# Patient Record
Sex: Female | Born: 1955
Health system: Southern US, Community
[De-identification: ages and names within clinical notes are randomized; demographics above are authoritative.]

## PROBLEM LIST (undated history)

## (undated) DIAGNOSIS — C801 Malignant (primary) neoplasm, unspecified: Secondary | ICD-10-CM

## (undated) DIAGNOSIS — E669 Obesity, unspecified: Secondary | ICD-10-CM

## (undated) DIAGNOSIS — I10 Essential (primary) hypertension: Secondary | ICD-10-CM

## (undated) DIAGNOSIS — Z8719 Personal history of other diseases of the digestive system: Secondary | ICD-10-CM

## (undated) DIAGNOSIS — E049 Nontoxic goiter, unspecified: Secondary | ICD-10-CM

## (undated) DIAGNOSIS — E7800 Pure hypercholesterolemia, unspecified: Secondary | ICD-10-CM

## (undated) DIAGNOSIS — J45909 Unspecified asthma, uncomplicated: Secondary | ICD-10-CM

---

## 2006-02-21 ENCOUNTER — Ambulatory Visit: Payer: Self-pay | Admitting: Internal Medicine

## 2006-03-05 ENCOUNTER — Other Ambulatory Visit: Payer: Self-pay

## 2006-03-05 ENCOUNTER — Inpatient Hospital Stay: Payer: Self-pay | Admitting: Internal Medicine

## 2006-04-28 ENCOUNTER — Ambulatory Visit: Payer: Self-pay | Admitting: Unknown Physician Specialty

## 2006-05-29 ENCOUNTER — Ambulatory Visit: Payer: Self-pay | Admitting: Internal Medicine

## 2009-08-17 ENCOUNTER — Ambulatory Visit: Payer: Self-pay

## 2013-04-22 ENCOUNTER — Ambulatory Visit: Payer: Self-pay | Admitting: Internal Medicine

## 2015-12-05 ENCOUNTER — Other Ambulatory Visit: Payer: Self-pay | Admitting: Internal Medicine

## 2015-12-05 DIAGNOSIS — Z1231 Encounter for screening mammogram for malignant neoplasm of breast: Secondary | ICD-10-CM

## 2015-12-07 ENCOUNTER — Ambulatory Visit
Admission: RE | Admit: 2015-12-07 | Discharge: 2015-12-07 | Disposition: A | Discharge status: Home / Self Care | Payer: 59 | Source: Ambulatory Visit | Attending: Internal Medicine | Admitting: Internal Medicine

## 2015-12-07 ENCOUNTER — Other Ambulatory Visit: Payer: Self-pay | Admitting: Internal Medicine

## 2015-12-07 DIAGNOSIS — Z1231 Encounter for screening mammogram for malignant neoplasm of breast: Secondary | ICD-10-CM

## 2016-02-20 DIAGNOSIS — H524 Presbyopia: Secondary | ICD-10-CM | POA: Diagnosis not present

## 2016-02-23 DIAGNOSIS — I10 Essential (primary) hypertension: Secondary | ICD-10-CM | POA: Diagnosis not present

## 2016-02-23 DIAGNOSIS — E7800 Pure hypercholesterolemia, unspecified: Secondary | ICD-10-CM | POA: Diagnosis not present

## 2016-03-01 DIAGNOSIS — I10 Essential (primary) hypertension: Secondary | ICD-10-CM | POA: Diagnosis not present

## 2016-03-01 DIAGNOSIS — R7301 Impaired fasting glucose: Secondary | ICD-10-CM | POA: Diagnosis not present

## 2016-03-01 DIAGNOSIS — R739 Hyperglycemia, unspecified: Secondary | ICD-10-CM | POA: Diagnosis not present

## 2016-03-01 DIAGNOSIS — E6609 Other obesity due to excess calories: Secondary | ICD-10-CM | POA: Diagnosis not present

## 2016-03-01 DIAGNOSIS — E7800 Pure hypercholesterolemia, unspecified: Secondary | ICD-10-CM | POA: Diagnosis not present

## 2016-05-03 DIAGNOSIS — K219 Gastro-esophageal reflux disease without esophagitis: Secondary | ICD-10-CM | POA: Diagnosis not present

## 2016-05-03 DIAGNOSIS — Z1211 Encounter for screening for malignant neoplasm of colon: Secondary | ICD-10-CM | POA: Diagnosis not present

## 2016-05-28 DIAGNOSIS — R7301 Impaired fasting glucose: Secondary | ICD-10-CM | POA: Diagnosis not present

## 2016-06-04 DIAGNOSIS — E7800 Pure hypercholesterolemia, unspecified: Secondary | ICD-10-CM | POA: Diagnosis not present

## 2016-06-04 DIAGNOSIS — R739 Hyperglycemia, unspecified: Secondary | ICD-10-CM | POA: Diagnosis not present

## 2016-06-04 DIAGNOSIS — I10 Essential (primary) hypertension: Secondary | ICD-10-CM | POA: Diagnosis not present

## 2016-06-04 DIAGNOSIS — E6609 Other obesity due to excess calories: Secondary | ICD-10-CM | POA: Diagnosis not present

## 2016-07-12 ENCOUNTER — Encounter: Payer: Self-pay | Admitting: *Deleted

## 2016-07-15 ENCOUNTER — Ambulatory Visit: Payer: 59 | Admitting: Anesthesiology

## 2016-07-15 ENCOUNTER — Encounter: Payer: Self-pay | Admitting: *Deleted

## 2016-07-15 ENCOUNTER — Encounter
Admission: RE | Disposition: A | Discharge status: Home Health | Payer: Self-pay | Source: Ambulatory Visit | Attending: Unknown Physician Specialty

## 2016-07-15 ENCOUNTER — Ambulatory Visit
Admission: RE | Admit: 2016-07-15 | Discharge: 2016-07-15 | Disposition: A | Discharge status: Home Health | Payer: 59 | Source: Ambulatory Visit | Attending: Unknown Physician Specialty | Admitting: Unknown Physician Specialty

## 2016-07-15 DIAGNOSIS — K210 Gastro-esophageal reflux disease with esophagitis: Secondary | ICD-10-CM | POA: Insufficient documentation

## 2016-07-15 DIAGNOSIS — J45909 Unspecified asthma, uncomplicated: Secondary | ICD-10-CM | POA: Diagnosis not present

## 2016-07-15 DIAGNOSIS — Z6841 Body Mass Index (BMI) 40.0 and over, adult: Secondary | ICD-10-CM | POA: Insufficient documentation

## 2016-07-15 DIAGNOSIS — E7800 Pure hypercholesterolemia, unspecified: Secondary | ICD-10-CM | POA: Insufficient documentation

## 2016-07-15 DIAGNOSIS — K5730 Diverticulosis of large intestine without perforation or abscess without bleeding: Secondary | ICD-10-CM | POA: Insufficient documentation

## 2016-07-15 DIAGNOSIS — K648 Other hemorrhoids: Secondary | ICD-10-CM | POA: Insufficient documentation

## 2016-07-15 DIAGNOSIS — K5790 Diverticulosis of intestine, part unspecified, without perforation or abscess without bleeding: Secondary | ICD-10-CM | POA: Diagnosis not present

## 2016-07-15 DIAGNOSIS — Z1211 Encounter for screening for malignant neoplasm of colon: Secondary | ICD-10-CM | POA: Diagnosis not present

## 2016-07-15 DIAGNOSIS — K2950 Unspecified chronic gastritis without bleeding: Secondary | ICD-10-CM | POA: Diagnosis not present

## 2016-07-15 DIAGNOSIS — E669 Obesity, unspecified: Secondary | ICD-10-CM | POA: Insufficient documentation

## 2016-07-15 DIAGNOSIS — I10 Essential (primary) hypertension: Secondary | ICD-10-CM | POA: Diagnosis not present

## 2016-07-15 DIAGNOSIS — K649 Unspecified hemorrhoids: Secondary | ICD-10-CM | POA: Diagnosis not present

## 2016-07-15 DIAGNOSIS — K2970 Gastritis, unspecified, without bleeding: Secondary | ICD-10-CM | POA: Diagnosis not present

## 2016-07-15 DIAGNOSIS — K3189 Other diseases of stomach and duodenum: Secondary | ICD-10-CM | POA: Diagnosis not present

## 2016-07-15 HISTORY — DX: Nontoxic goiter, unspecified: E04.9

## 2016-07-15 HISTORY — DX: Obesity, unspecified: E66.9

## 2016-07-15 HISTORY — DX: Essential (primary) hypertension: I10

## 2016-07-15 HISTORY — DX: Pure hypercholesterolemia, unspecified: E78.00

## 2016-07-15 HISTORY — PX: ESOPHAGOGASTRODUODENOSCOPY (EGD) WITH PROPOFOL: SHX5813

## 2016-07-15 HISTORY — DX: Unspecified asthma, uncomplicated: J45.909

## 2016-07-15 HISTORY — DX: Personal history of other diseases of the digestive system: Z87.19

## 2016-07-15 HISTORY — PX: COLONOSCOPY WITH PROPOFOL: SHX5780

## 2016-07-15 SURGERY — COLONOSCOPY WITH PROPOFOL
Anesthesia: General

## 2016-07-15 MED ORDER — SODIUM CHLORIDE 0.9 % IV SOLN
INTRAVENOUS | Status: DC
  Administered 2016-07-15: 15:00:00 via INTRAVENOUS
  Administered 2016-07-15: 1000 mL via INTRAVENOUS

## 2016-07-15 MED ORDER — PROPOFOL 500 MG/50ML IV EMUL
INTRAVENOUS | Status: DC | PRN
  Administered 2016-07-15: 150 ug/kg/min via INTRAVENOUS

## 2016-07-15 MED ORDER — PROPOFOL 10 MG/ML IV BOLUS
INTRAVENOUS | Status: DC | PRN
  Administered 2016-07-15: 50 mg via INTRAVENOUS

## 2016-07-15 MED ORDER — MIDAZOLAM HCL 2 MG/2ML IJ SOLN
INTRAMUSCULAR | Status: DC | PRN
  Administered 2016-07-15: 2 mg via INTRAVENOUS

## 2016-07-15 MED ORDER — SODIUM CHLORIDE 0.9 % IV SOLN: INTRAVENOUS | Status: DC

## 2016-07-15 MED ORDER — LIDOCAINE HCL (CARDIAC) 20 MG/ML IV SOLN
INTRAVENOUS | Status: DC | PRN
  Administered 2016-07-15: 100 mg via INTRAVENOUS

## 2016-07-15 NOTE — Anesthesia Preprocedure Evaluation (Signed)
Anesthesia Evaluation  Patient identified by MRN, date of birth, ID band Patient awake    Reviewed: Allergy & Precautions, H&P , NPO status , Patient's Chart, lab work & pertinent test results, reviewed documented beta blocker date and time   Airway Mallampati: II   Neck ROM: full    Dental  (+) Teeth Intact   Pulmonary neg pulmonary ROS, neg shortness of breath, asthma ,    Pulmonary exam normal        Cardiovascular hypertension, negative cardio ROS Normal cardiovascular exam Rhythm:regular Rate:Normal     Neuro/Psych negative neurological ROS  negative psych ROS   GI/Hepatic negative GI ROS, Neg liver ROS,   Endo/Other  negative endocrine ROS  Renal/GU negative Renal ROS  negative genitourinary   Musculoskeletal   Abdominal   Peds  Hematology negative hematology ROS (+)   Anesthesia Other Findings Past Medical History: No date: Goiter No date: Hypercholesterolemia No date: Hypertension No date: Obesity No past surgical history on file.   Reproductive/Obstetrics                             Anesthesia Physical Anesthesia Plan  ASA: III  Anesthesia Plan: General   Post-op Pain Management:    Induction:   Airway Management Planned:   Additional Equipment:   Intra-op Plan:   Post-operative Plan:   Informed Consent: I have reviewed the patients History and Physical, chart, labs and discussed the procedure including the risks, benefits and alternatives for the proposed anesthesia with the patient or authorized representative who has indicated his/her understanding and acceptance.   Dental Advisory Given  Plan Discussed with: CRNA  Anesthesia Plan Comments:         Anesthesia Quick Evaluation

## 2016-07-15 NOTE — H&P (Signed)
   Primary Care Physician:  Madelyn Brunner, MD Primary Gastroenterologist:  Dr. Vira Agar  Pre-Procedure History & Physical: HPI:  Bianca Cooke is a 60 y.o. female is here for an endoscopy and colonoscopy.   Past Medical History:  Diagnosis Date  . Asthma   . Goiter   . History of hiatal hernia   . Hypercholesterolemia   . Hypertension   . Obesity     Past Surgical History:  Procedure Laterality Date  . CESAREAN SECTION      Prior to Admission medications   Medication Sig Start Date End Date Taking? Authorizing Provider  albuterol (ACCUNEB) 0.63 MG/3ML nebulizer solution Take 1 ampule by nebulization every 6 (six) hours as needed for wheezing.   Yes Historical Provider, MD  famotidine (PEPCID) 20 MG tablet Take 20 mg by mouth 2 (two) times daily.   Yes Historical Provider, MD  fexofenadine (ALLEGRA) 180 MG tablet Take 180 mg by mouth daily.   Yes Historical Provider, MD  fluticasone (FLONASE) 50 MCG/ACT nasal spray Place into both nostrils daily.   Yes Historical Provider, MD  hydrochlorothiazide (HYDRODIURIL) 25 MG tablet Take 25 mg by mouth daily.   Yes Historical Provider, MD  LORazepam (ATIVAN) 0.5 MG tablet Take 0.5 mg by mouth every 8 (eight) hours.   Yes Historical Provider, MD  losartan (COZAAR) 50 MG tablet Take 50 mg by mouth daily.   Yes Historical Provider, MD    Allergies as of 06/26/2016  . (Not on File)    Family History  Problem Relation Age of Onset  . Breast cancer Maternal Aunt   . Breast cancer Maternal Grandmother   . Breast cancer Maternal Aunt     Social History   Social History  . Marital status: Married    Spouse name: N/A  . Number of children: N/A  . Years of education: N/A   Occupational History  . Not on file.   Social History Main Topics  . Smoking status: Never Smoker  . Smokeless tobacco: Never Used  . Alcohol use Yes     Comment: occasional wine  . Drug use: No  . Sexual activity: Not on file   Other Topics Concern   . Not on file   Social History Narrative  . No narrative on file    Review of Systems: See HPI, otherwise negative ROS  Physical Exam: BP 124/81   Pulse 91   Temp (!) 96.7 F (35.9 C) (Tympanic)   Resp 13   Ht 5\' 3"  (1.6 m)   Wt 113.4 kg (250 lb)   SpO2 99%   BMI 44.29 kg/m  General:   Alert,  pleasant and cooperative in NAD Head:  Normocephalic and atraumatic. Neck:  Supple; no masses or thyromegaly. Lungs:  Clear throughout to auscultation.    Heart:  Regular rate and rhythm. Abdomen:  Soft, nontender and nondistended. Normal bowel sounds, without guarding, and without rebound.   Neurologic:  Alert and  oriented x4;  grossly normal neurologically.  Impression/Plan: Bianca Cooke is here for an endoscopy and colonoscopy to be performed for screening and GERD  Risks, benefits, limitations, and alternatives regarding  endoscopy and colonoscopy have been reviewed with the patient.  Questions have been answered.  All parties agreeable.   Gaylyn Cheers, MD  07/15/2016, 4:04 PM

## 2016-07-15 NOTE — Op Note (Signed)
[facility] Patient Name: Bianca Cooke Procedure Date: 07/15/2016 3:23 PM MRN: [id number] Account #: [id number] Date of Birth: 06-08-1956 Admit Type: Outpatient Age: 60 Room: [facility] ENDO ROOM 4 Gender: Female Note Status: Finalized Procedure:            Upper GI endoscopy Indications:          Heartburn Providers:            Manya Silvas, MD Referring MD:         Hewitt Blade. Sarina Ser, MD (Referring MD) Medicines:            Propofol per Anesthesia Complications:        No immediate complications. Procedure:            Pre-Anesthesia Assessment:                       - After reviewing the risks and benefits, the patient                        was deemed in satisfactory condition to undergo the                        procedure.                       After obtaining informed consent, the endoscope was                        passed under direct vision. Throughout the procedure,                        the patient's blood pressure, pulse, and oxygen                        saturations were monitored continuously. The Endoscope                        was introduced through the mouth, and advanced to the                        second part of duodenum. The upper GI endoscopy was                        accomplished without difficulty. The patient tolerated                        the procedure well. Findings:      LA Grade A (one or more mucosal breaks less than 5 mm, not extending       between tops of 2 mucosal folds) esophagitis with no bleeding was found       40 cm from the incisors. Biopsies were taken with a cold forceps for       histology. GEJ 41cm.      Diffuse mildly erythematous mucosa without bleeding was found in the       gastric antrum. Biopsies were taken with a cold forceps for histology.       Biopsies were taken with a cold forceps for Helicobacter pylori testing.      The examined duodenum was normal. Impression:           - LA Grade  A reflux esophagitis. Rule out Barrett's                        esophagus. Biopsied.                       - Erythematous mucosa in the antrum. Biopsied.                       - Normal examined duodenum. Recommendation:       - Await pathology results.                       - Perform a colonoscopy as previously scheduled. Manya Silvas, MD 07/15/2016 3:39:18 PM This report has been signed electronically. Number of Addenda: 0 Note Initiated On: 07/15/2016 3:23 PM      [facility]

## 2016-07-15 NOTE — Transfer of Care (Signed)
Immediate Anesthesia Transfer of Care Note  Patient: JANEICE WELTON  Procedure(s) Performed: Procedure(s): COLONOSCOPY WITH PROPOFOL (N/A) ESOPHAGOGASTRODUODENOSCOPY (EGD) WITH PROPOFOL (N/A)  Patient Location: Endoscopy Unit  Anesthesia Type:General  Level of Consciousness: awake, alert , oriented and patient cooperative  Airway & Oxygen Therapy: Patient Spontanous Breathing and Patient connected to nasal cannula oxygen  Post-op Assessment: Report given to RN, Post -op Vital signs reviewed and stable and Patient moving all extremities X 4  Post vital signs: Reviewed and stable  Last Vitals:  Vitals:   07/15/16 1436 07/15/16 1556  BP: (!) 151/97   Pulse: 93   Resp: 18   Temp: 37.4 C (!) (P) 35.9 C    Last Pain:  Vitals:   07/15/16 1556  TempSrc: (P) Tympanic         Complications: No apparent anesthesia complications

## 2016-07-15 NOTE — Anesthesia Postprocedure Evaluation (Signed)
Anesthesia Post Note  Patient: SHAWNDREA IRIGOYEN  Procedure(s) Performed: Procedure(s) (LRB): COLONOSCOPY WITH PROPOFOL (N/A) ESOPHAGOGASTRODUODENOSCOPY (EGD) WITH PROPOFOL (N/A)  Patient location during evaluation: Endoscopy Anesthesia Type: General Level of consciousness: awake and alert Pain management: pain level controlled Vital Signs Assessment: post-procedure vital signs reviewed and stable Respiratory status: spontaneous breathing and respiratory function stable Cardiovascular status: stable Anesthetic complications: no    Last Vitals:  Vitals:   07/15/16 1436 07/15/16 1556  BP: (!) 151/97 124/81  Pulse: 93 91  Resp: 18 13  Temp: 37.4 C (!) 35.9 C    Last Pain:  Vitals:   07/15/16 1556  TempSrc: Tympanic                 KEPHART,WILLIAM K

## 2016-07-15 NOTE — Op Note (Signed)
[facility] Patient Name: Bianca Cooke Procedure Date: 07/15/2016 3:39 PM MRN: [id number] Account #: [id number] Date of Birth: [birth year] Admit Type: Outpatient Age: 60 Room: [facility] ENDO ROOM 4 Gender: Female Note Status: Finalized Procedure:            Colonoscopy Indications:          Screening for colorectal malignant neoplasm Providers:            Manya Silvas, MD Medicines:            Propofol per Anesthesia Complications:        No immediate complications. Procedure:            Pre-Anesthesia Assessment:                       - After reviewing the risks and benefits, the patient                        was deemed in satisfactory condition to undergo the                        procedure.                       After obtaining informed consent, the colonoscope was                        passed under direct vision. Throughout the procedure,                        the patient's blood pressure, pulse, and oxygen                        saturations were monitored continuously. The                        Colonoscope was introduced through the anus and                        advanced to the the cecum, identified by appendiceal                        orifice and ileocecal valve. The colonoscopy was                        performed without difficulty. The patient tolerated the                        procedure well. The quality of the bowel preparation                        was good. Findings:      Internal hemorrhoids were found during endoscopy. The hemorrhoids were       small and Grade I (internal hemorrhoids that do not prolapse).      A single small-mouthed diverticulum was found in the sigmoid colon.      The exam was otherwise without abnormality. Impression:           - Internal hemorrhoids.                       - Diverticulosis in the sigmoid  colon.                       - The examination was otherwise normal.                       - No  specimens collected. Recommendation:       - Repeat colonoscopy in 10 years for screening purposes.                       - Resume previous diet indefinitely. Manya Silvas, MD 07/15/2016 3:54:43 PM This report has been signed electronically. Number of Addenda: 0 Note Initiated On: 07/15/2016 3:39 PM Scope Withdrawal Time: 0 hours 6 minutes 48 seconds  Total Procedure Duration: 0 hours 10 minutes 59 seconds       [facility]

## 2016-07-16 ENCOUNTER — Encounter: Payer: Self-pay | Admitting: Unknown Physician Specialty

## 2016-07-17 LAB — SURGICAL PATHOLOGY

## 2016-09-05 DIAGNOSIS — R739 Hyperglycemia, unspecified: Secondary | ICD-10-CM | POA: Diagnosis not present

## 2016-09-05 DIAGNOSIS — I10 Essential (primary) hypertension: Secondary | ICD-10-CM | POA: Diagnosis not present

## 2016-09-05 DIAGNOSIS — E7800 Pure hypercholesterolemia, unspecified: Secondary | ICD-10-CM | POA: Diagnosis not present

## 2016-09-12 DIAGNOSIS — E7800 Pure hypercholesterolemia, unspecified: Secondary | ICD-10-CM | POA: Diagnosis not present

## 2016-09-12 DIAGNOSIS — I10 Essential (primary) hypertension: Secondary | ICD-10-CM | POA: Diagnosis not present

## 2016-09-12 DIAGNOSIS — Z0001 Encounter for general adult medical examination with abnormal findings: Secondary | ICD-10-CM | POA: Diagnosis not present

## 2016-09-12 DIAGNOSIS — E6609 Other obesity due to excess calories: Secondary | ICD-10-CM | POA: Diagnosis not present

## 2016-09-12 DIAGNOSIS — E049 Nontoxic goiter, unspecified: Secondary | ICD-10-CM | POA: Diagnosis not present

## 2017-03-06 DIAGNOSIS — R7301 Impaired fasting glucose: Secondary | ICD-10-CM | POA: Diagnosis not present

## 2017-03-08 IMAGING — MG MM DIGITAL SCREENING BILAT W/ TOMO W/ CAD
8 of 12 series · 8 of 28 positions shown · non-contrast
Comparison: Previous exam(s).

ACR Breast Density Category a: The breast tissue is almost entirely
fatty.

CLINICAL DATA: Screening.

EXAM:
2D DIGITAL SCREENING BILATERAL MAMMOGRAM WITH CAD AND ADJUNCT TOMO

[L CC]
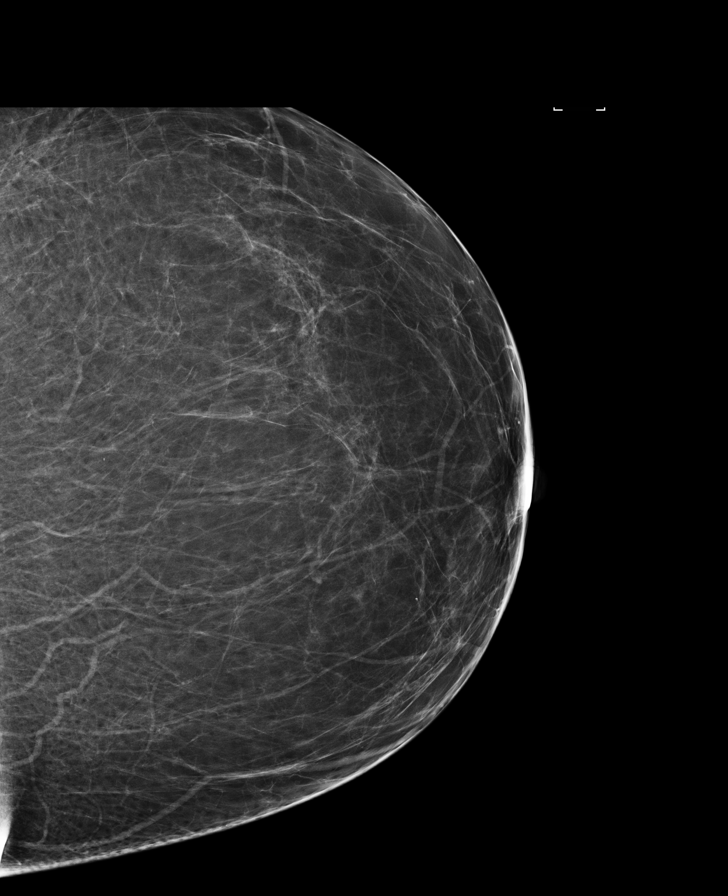

[L MLO synth-2D]
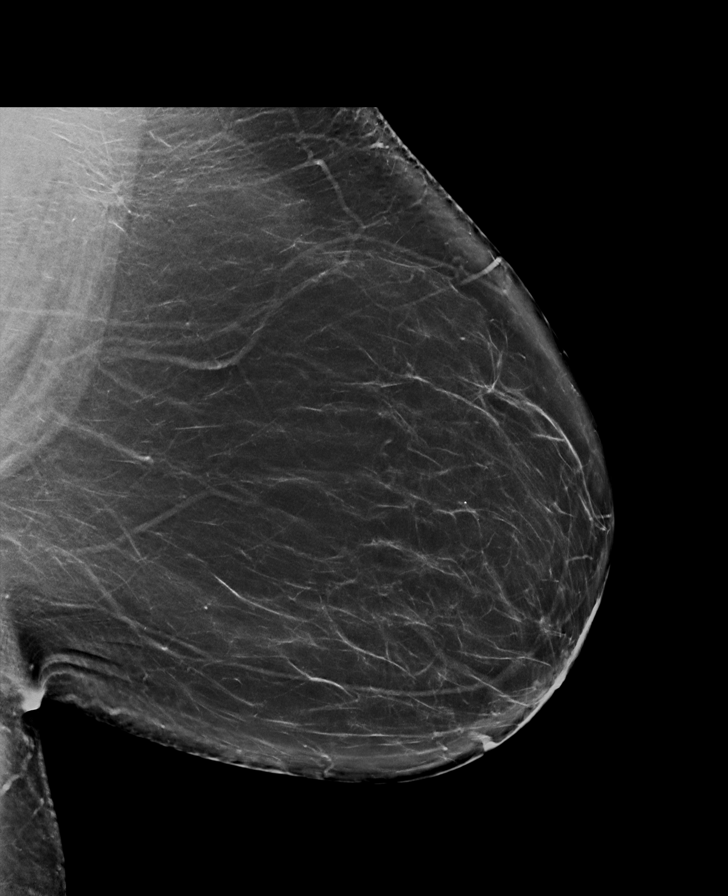

[L CC synth-2D]
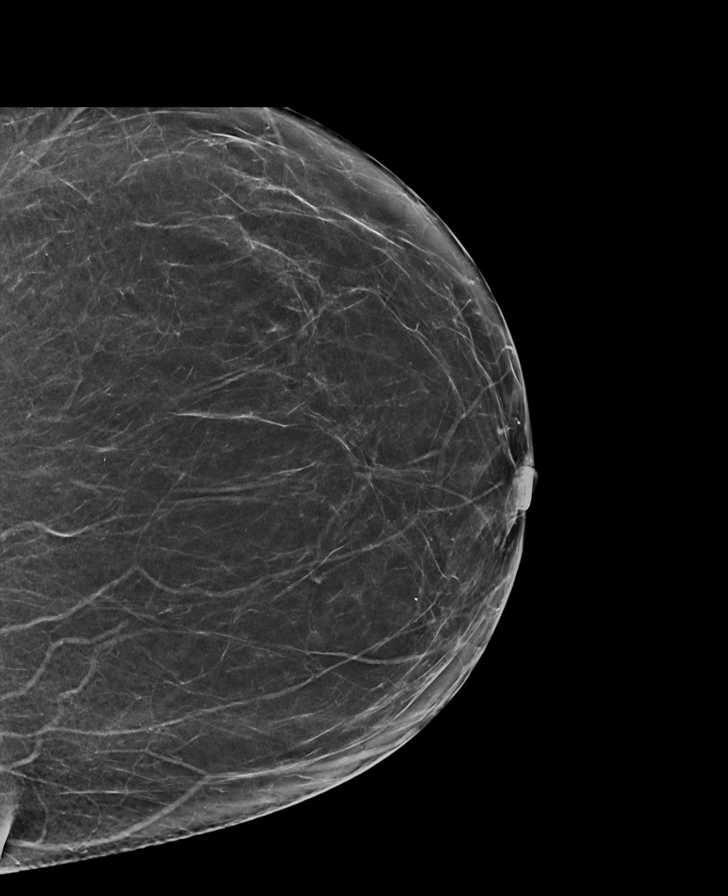

[R MLO]
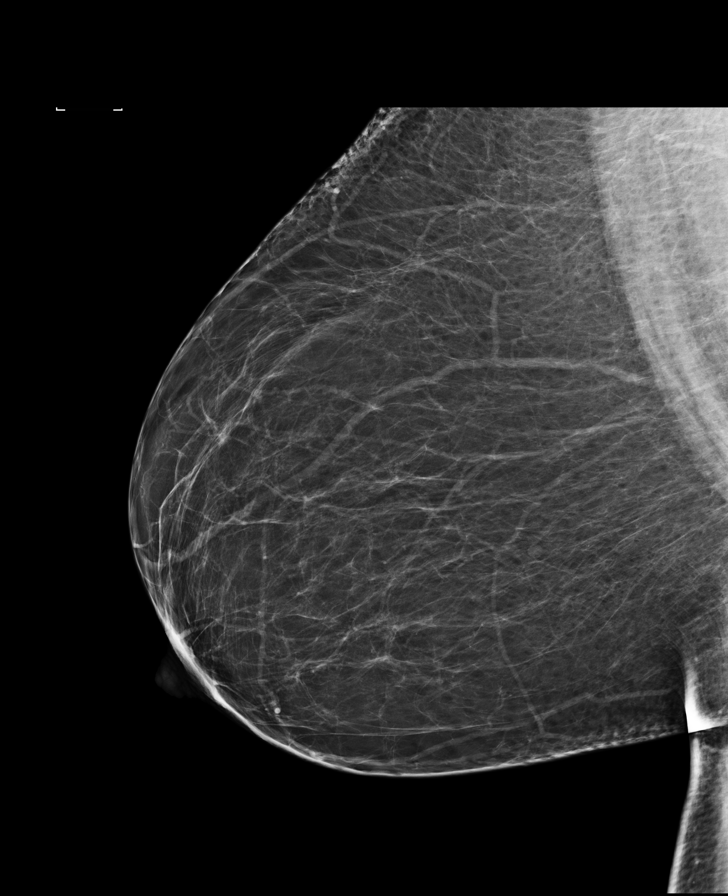

[L MLO]
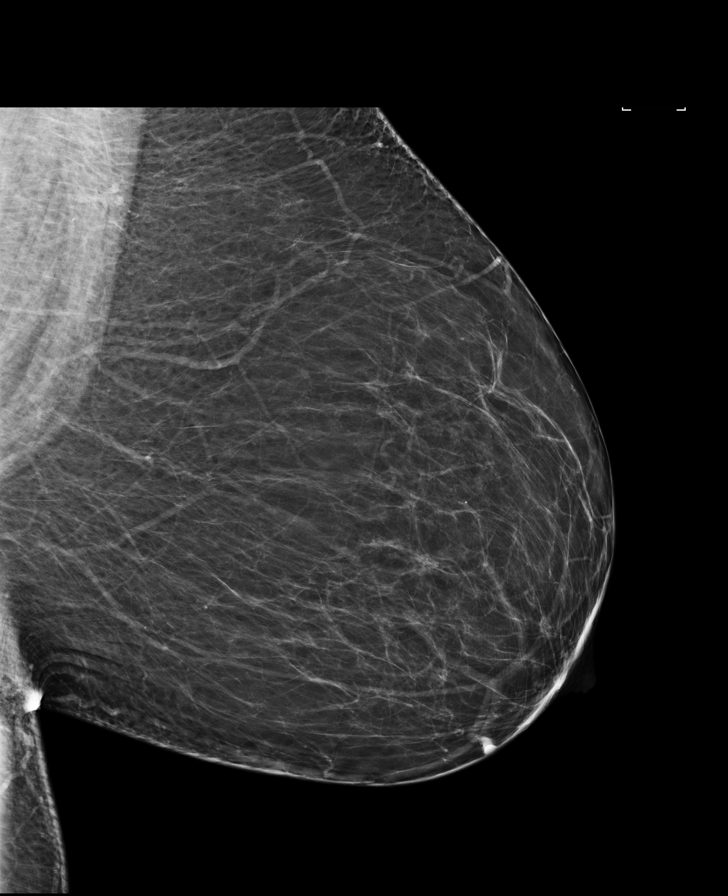

[R CC]
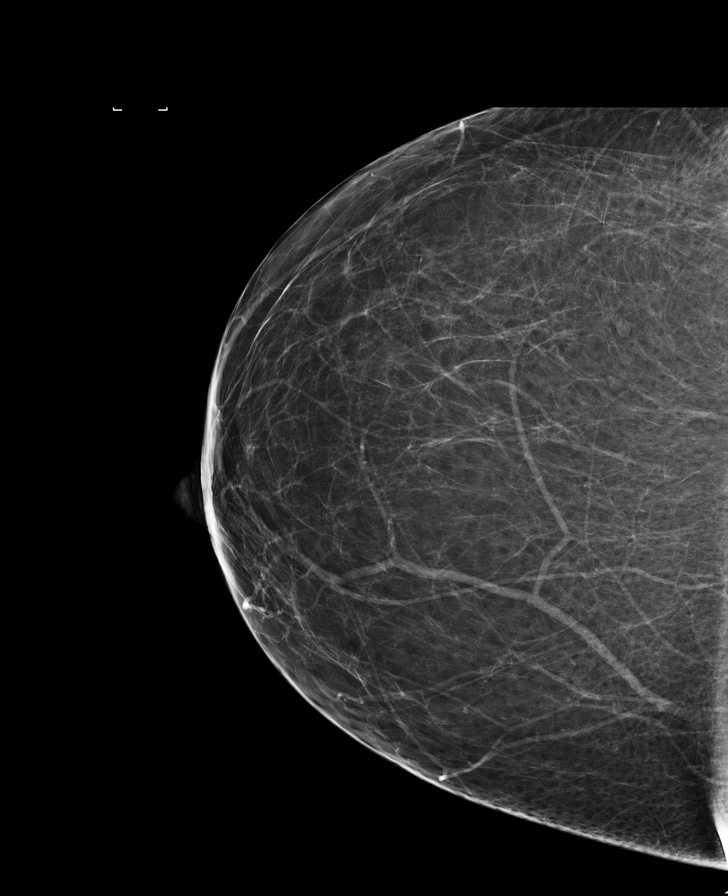

[R MLO synth-2D]
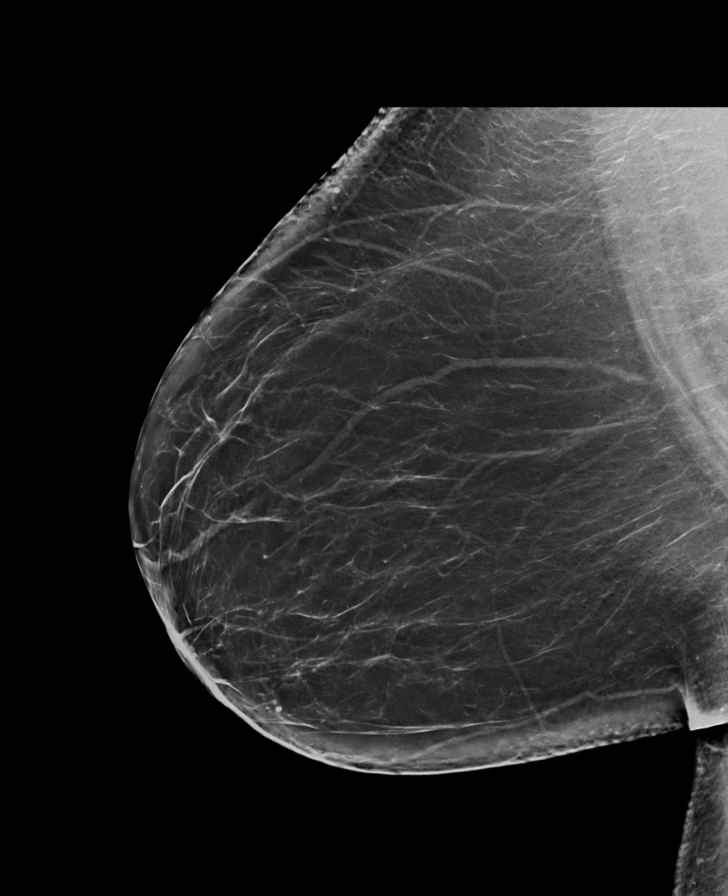

[R CC synth-2D]
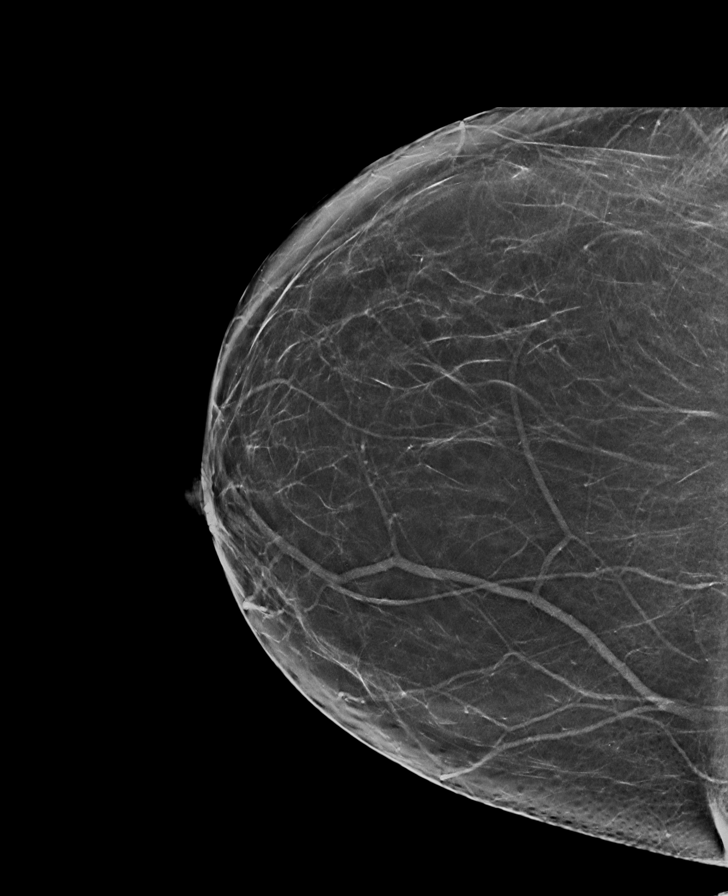

[8 of 28 positions shown; findings below may reference images not displayed]

FINDINGS: There are no findings suspicious for malignancy. Images were
processed with CAD.
IMPRESSION: No mammographic evidence of malignancy. A result letter of this
screening mammogram will be mailed directly to the patient.

RECOMMENDATION:
Screening mammogram in one year. (Code:1B-X-8P0)

BI-RADS CATEGORY  1: Negative.

## 2017-03-13 DIAGNOSIS — E6609 Other obesity due to excess calories: Secondary | ICD-10-CM | POA: Diagnosis not present

## 2017-03-13 DIAGNOSIS — R739 Hyperglycemia, unspecified: Secondary | ICD-10-CM | POA: Diagnosis not present

## 2017-03-13 DIAGNOSIS — J4520 Mild intermittent asthma, uncomplicated: Secondary | ICD-10-CM | POA: Diagnosis not present

## 2017-03-13 DIAGNOSIS — I10 Essential (primary) hypertension: Secondary | ICD-10-CM | POA: Diagnosis not present

## 2017-06-06 DIAGNOSIS — R739 Hyperglycemia, unspecified: Secondary | ICD-10-CM | POA: Diagnosis not present

## 2017-06-19 DIAGNOSIS — E119 Type 2 diabetes mellitus without complications: Secondary | ICD-10-CM | POA: Diagnosis not present

## 2017-09-18 DIAGNOSIS — E119 Type 2 diabetes mellitus without complications: Secondary | ICD-10-CM | POA: Diagnosis not present

## 2017-09-19 DIAGNOSIS — E7800 Pure hypercholesterolemia, unspecified: Secondary | ICD-10-CM | POA: Diagnosis not present

## 2017-09-19 DIAGNOSIS — I10 Essential (primary) hypertension: Secondary | ICD-10-CM | POA: Diagnosis not present

## 2017-09-19 DIAGNOSIS — E118 Type 2 diabetes mellitus with unspecified complications: Secondary | ICD-10-CM | POA: Diagnosis not present

## 2017-09-19 DIAGNOSIS — J4520 Mild intermittent asthma, uncomplicated: Secondary | ICD-10-CM | POA: Diagnosis not present

## 2017-10-28 DIAGNOSIS — M545 Low back pain: Secondary | ICD-10-CM | POA: Diagnosis not present

## 2017-10-28 DIAGNOSIS — M5416 Radiculopathy, lumbar region: Secondary | ICD-10-CM | POA: Diagnosis not present

## 2017-10-28 DIAGNOSIS — M9903 Segmental and somatic dysfunction of lumbar region: Secondary | ICD-10-CM | POA: Diagnosis not present

## 2017-10-28 DIAGNOSIS — M62830 Muscle spasm of back: Secondary | ICD-10-CM | POA: Diagnosis not present

## 2017-10-30 DIAGNOSIS — M62830 Muscle spasm of back: Secondary | ICD-10-CM | POA: Diagnosis not present

## 2017-10-30 DIAGNOSIS — M9903 Segmental and somatic dysfunction of lumbar region: Secondary | ICD-10-CM | POA: Diagnosis not present

## 2017-10-30 DIAGNOSIS — M5416 Radiculopathy, lumbar region: Secondary | ICD-10-CM | POA: Diagnosis not present

## 2017-10-30 DIAGNOSIS — M545 Low back pain: Secondary | ICD-10-CM | POA: Diagnosis not present

## 2017-11-03 DIAGNOSIS — M9903 Segmental and somatic dysfunction of lumbar region: Secondary | ICD-10-CM | POA: Diagnosis not present

## 2017-11-03 DIAGNOSIS — M5416 Radiculopathy, lumbar region: Secondary | ICD-10-CM | POA: Diagnosis not present

## 2017-11-03 DIAGNOSIS — M62830 Muscle spasm of back: Secondary | ICD-10-CM | POA: Diagnosis not present

## 2017-11-03 DIAGNOSIS — M545 Low back pain: Secondary | ICD-10-CM | POA: Diagnosis not present

## 2017-11-05 DIAGNOSIS — M5416 Radiculopathy, lumbar region: Secondary | ICD-10-CM | POA: Diagnosis not present

## 2017-11-05 DIAGNOSIS — M9903 Segmental and somatic dysfunction of lumbar region: Secondary | ICD-10-CM | POA: Diagnosis not present

## 2017-11-05 DIAGNOSIS — M545 Low back pain: Secondary | ICD-10-CM | POA: Diagnosis not present

## 2017-11-05 DIAGNOSIS — M62830 Muscle spasm of back: Secondary | ICD-10-CM | POA: Diagnosis not present

## 2017-11-10 DIAGNOSIS — M62830 Muscle spasm of back: Secondary | ICD-10-CM | POA: Diagnosis not present

## 2017-11-10 DIAGNOSIS — M545 Low back pain: Secondary | ICD-10-CM | POA: Diagnosis not present

## 2017-11-10 DIAGNOSIS — M9903 Segmental and somatic dysfunction of lumbar region: Secondary | ICD-10-CM | POA: Diagnosis not present

## 2017-11-10 DIAGNOSIS — M5416 Radiculopathy, lumbar region: Secondary | ICD-10-CM | POA: Diagnosis not present

## 2017-11-12 DIAGNOSIS — M9903 Segmental and somatic dysfunction of lumbar region: Secondary | ICD-10-CM | POA: Diagnosis not present

## 2017-11-12 DIAGNOSIS — M5416 Radiculopathy, lumbar region: Secondary | ICD-10-CM | POA: Diagnosis not present

## 2017-11-12 DIAGNOSIS — M545 Low back pain: Secondary | ICD-10-CM | POA: Diagnosis not present

## 2017-11-12 DIAGNOSIS — M62830 Muscle spasm of back: Secondary | ICD-10-CM | POA: Diagnosis not present

## 2017-11-17 DIAGNOSIS — M545 Low back pain: Secondary | ICD-10-CM | POA: Diagnosis not present

## 2017-11-17 DIAGNOSIS — M9903 Segmental and somatic dysfunction of lumbar region: Secondary | ICD-10-CM | POA: Diagnosis not present

## 2017-11-17 DIAGNOSIS — M62830 Muscle spasm of back: Secondary | ICD-10-CM | POA: Diagnosis not present

## 2017-11-17 DIAGNOSIS — M5416 Radiculopathy, lumbar region: Secondary | ICD-10-CM | POA: Diagnosis not present

## 2017-11-20 DIAGNOSIS — M545 Low back pain: Secondary | ICD-10-CM | POA: Diagnosis not present

## 2017-11-20 DIAGNOSIS — M5416 Radiculopathy, lumbar region: Secondary | ICD-10-CM | POA: Diagnosis not present

## 2017-11-20 DIAGNOSIS — M9903 Segmental and somatic dysfunction of lumbar region: Secondary | ICD-10-CM | POA: Diagnosis not present

## 2017-11-20 DIAGNOSIS — M62830 Muscle spasm of back: Secondary | ICD-10-CM | POA: Diagnosis not present

## 2017-11-24 DIAGNOSIS — M5416 Radiculopathy, lumbar region: Secondary | ICD-10-CM | POA: Diagnosis not present

## 2017-11-24 DIAGNOSIS — M9903 Segmental and somatic dysfunction of lumbar region: Secondary | ICD-10-CM | POA: Diagnosis not present

## 2017-11-24 DIAGNOSIS — M62830 Muscle spasm of back: Secondary | ICD-10-CM | POA: Diagnosis not present

## 2017-11-24 DIAGNOSIS — M545 Low back pain: Secondary | ICD-10-CM | POA: Diagnosis not present

## 2017-11-27 DIAGNOSIS — M9903 Segmental and somatic dysfunction of lumbar region: Secondary | ICD-10-CM | POA: Diagnosis not present

## 2017-11-27 DIAGNOSIS — M62830 Muscle spasm of back: Secondary | ICD-10-CM | POA: Diagnosis not present

## 2017-11-27 DIAGNOSIS — M5416 Radiculopathy, lumbar region: Secondary | ICD-10-CM | POA: Diagnosis not present

## 2017-11-27 DIAGNOSIS — M545 Low back pain: Secondary | ICD-10-CM | POA: Diagnosis not present

## 2017-12-01 DIAGNOSIS — M5416 Radiculopathy, lumbar region: Secondary | ICD-10-CM | POA: Diagnosis not present

## 2017-12-01 DIAGNOSIS — M545 Low back pain: Secondary | ICD-10-CM | POA: Diagnosis not present

## 2017-12-01 DIAGNOSIS — M9903 Segmental and somatic dysfunction of lumbar region: Secondary | ICD-10-CM | POA: Diagnosis not present

## 2017-12-01 DIAGNOSIS — M62830 Muscle spasm of back: Secondary | ICD-10-CM | POA: Diagnosis not present

## 2017-12-03 DIAGNOSIS — M9903 Segmental and somatic dysfunction of lumbar region: Secondary | ICD-10-CM | POA: Diagnosis not present

## 2017-12-03 DIAGNOSIS — M62830 Muscle spasm of back: Secondary | ICD-10-CM | POA: Diagnosis not present

## 2017-12-03 DIAGNOSIS — M545 Low back pain: Secondary | ICD-10-CM | POA: Diagnosis not present

## 2017-12-03 DIAGNOSIS — M5416 Radiculopathy, lumbar region: Secondary | ICD-10-CM | POA: Diagnosis not present

## 2017-12-08 DIAGNOSIS — M5416 Radiculopathy, lumbar region: Secondary | ICD-10-CM | POA: Diagnosis not present

## 2017-12-08 DIAGNOSIS — M62830 Muscle spasm of back: Secondary | ICD-10-CM | POA: Diagnosis not present

## 2017-12-08 DIAGNOSIS — M545 Low back pain: Secondary | ICD-10-CM | POA: Diagnosis not present

## 2017-12-08 DIAGNOSIS — M9903 Segmental and somatic dysfunction of lumbar region: Secondary | ICD-10-CM | POA: Diagnosis not present

## 2017-12-10 DIAGNOSIS — M9903 Segmental and somatic dysfunction of lumbar region: Secondary | ICD-10-CM | POA: Diagnosis not present

## 2017-12-10 DIAGNOSIS — M545 Low back pain: Secondary | ICD-10-CM | POA: Diagnosis not present

## 2017-12-10 DIAGNOSIS — M9901 Segmental and somatic dysfunction of cervical region: Secondary | ICD-10-CM | POA: Diagnosis not present

## 2017-12-10 DIAGNOSIS — M542 Cervicalgia: Secondary | ICD-10-CM | POA: Diagnosis not present

## 2017-12-11 DIAGNOSIS — E118 Type 2 diabetes mellitus with unspecified complications: Secondary | ICD-10-CM | POA: Diagnosis not present

## 2017-12-15 DIAGNOSIS — M9903 Segmental and somatic dysfunction of lumbar region: Secondary | ICD-10-CM | POA: Diagnosis not present

## 2017-12-15 DIAGNOSIS — M542 Cervicalgia: Secondary | ICD-10-CM | POA: Diagnosis not present

## 2017-12-15 DIAGNOSIS — E118 Type 2 diabetes mellitus with unspecified complications: Secondary | ICD-10-CM | POA: Diagnosis not present

## 2017-12-15 DIAGNOSIS — M545 Low back pain: Secondary | ICD-10-CM | POA: Diagnosis not present

## 2017-12-15 DIAGNOSIS — M9901 Segmental and somatic dysfunction of cervical region: Secondary | ICD-10-CM | POA: Diagnosis not present

## 2017-12-18 DIAGNOSIS — E118 Type 2 diabetes mellitus with unspecified complications: Secondary | ICD-10-CM | POA: Diagnosis not present

## 2017-12-18 DIAGNOSIS — J4520 Mild intermittent asthma, uncomplicated: Secondary | ICD-10-CM | POA: Diagnosis not present

## 2017-12-18 DIAGNOSIS — I10 Essential (primary) hypertension: Secondary | ICD-10-CM | POA: Diagnosis not present

## 2017-12-18 DIAGNOSIS — E7800 Pure hypercholesterolemia, unspecified: Secondary | ICD-10-CM | POA: Diagnosis not present

## 2018-03-27 DIAGNOSIS — E118 Type 2 diabetes mellitus with unspecified complications: Secondary | ICD-10-CM | POA: Diagnosis not present

## 2018-04-03 DIAGNOSIS — I10 Essential (primary) hypertension: Secondary | ICD-10-CM | POA: Diagnosis not present

## 2018-04-03 DIAGNOSIS — E7800 Pure hypercholesterolemia, unspecified: Secondary | ICD-10-CM | POA: Diagnosis not present

## 2018-04-03 DIAGNOSIS — E118 Type 2 diabetes mellitus with unspecified complications: Secondary | ICD-10-CM | POA: Diagnosis not present

## 2018-04-03 DIAGNOSIS — J4520 Mild intermittent asthma, uncomplicated: Secondary | ICD-10-CM | POA: Diagnosis not present

## 2018-07-28 DIAGNOSIS — E118 Type 2 diabetes mellitus with unspecified complications: Secondary | ICD-10-CM | POA: Diagnosis not present

## 2018-08-04 DIAGNOSIS — I10 Essential (primary) hypertension: Secondary | ICD-10-CM | POA: Diagnosis not present

## 2018-08-04 DIAGNOSIS — J4520 Mild intermittent asthma, uncomplicated: Secondary | ICD-10-CM | POA: Diagnosis not present

## 2018-08-04 DIAGNOSIS — E118 Type 2 diabetes mellitus with unspecified complications: Secondary | ICD-10-CM | POA: Diagnosis not present

## 2018-08-04 DIAGNOSIS — E7800 Pure hypercholesterolemia, unspecified: Secondary | ICD-10-CM | POA: Diagnosis not present

## 2018-09-15 ENCOUNTER — Other Ambulatory Visit: Payer: Self-pay | Admitting: Internal Medicine

## 2018-09-15 DIAGNOSIS — Z1231 Encounter for screening mammogram for malignant neoplasm of breast: Secondary | ICD-10-CM

## 2018-10-07 ENCOUNTER — Ambulatory Visit
Admission: RE | Admit: 2018-10-07 | Discharge: 2018-10-07 | Disposition: A | Discharge status: Home / Self Care | Payer: 59 | Source: Ambulatory Visit | Attending: Internal Medicine | Admitting: Internal Medicine

## 2018-10-07 DIAGNOSIS — Z1231 Encounter for screening mammogram for malignant neoplasm of breast: Secondary | ICD-10-CM | POA: Insufficient documentation

## 2018-10-07 HISTORY — DX: Malignant (primary) neoplasm, unspecified: C80.1

## 2018-10-26 DIAGNOSIS — E118 Type 2 diabetes mellitus with unspecified complications: Secondary | ICD-10-CM | POA: Diagnosis not present

## 2018-10-26 DIAGNOSIS — E7800 Pure hypercholesterolemia, unspecified: Secondary | ICD-10-CM | POA: Diagnosis not present

## 2018-11-02 DIAGNOSIS — I10 Essential (primary) hypertension: Secondary | ICD-10-CM | POA: Diagnosis not present

## 2018-11-02 DIAGNOSIS — J4520 Mild intermittent asthma, uncomplicated: Secondary | ICD-10-CM | POA: Diagnosis not present

## 2018-11-02 DIAGNOSIS — E118 Type 2 diabetes mellitus with unspecified complications: Secondary | ICD-10-CM | POA: Diagnosis not present

## 2018-11-02 DIAGNOSIS — Z0001 Encounter for general adult medical examination with abnormal findings: Secondary | ICD-10-CM | POA: Diagnosis not present

## 2019-02-23 DIAGNOSIS — E118 Type 2 diabetes mellitus with unspecified complications: Secondary | ICD-10-CM | POA: Diagnosis not present

## 2019-03-02 DIAGNOSIS — I10 Essential (primary) hypertension: Secondary | ICD-10-CM | POA: Diagnosis not present

## 2019-03-02 DIAGNOSIS — E118 Type 2 diabetes mellitus with unspecified complications: Secondary | ICD-10-CM | POA: Diagnosis not present

## 2019-03-02 DIAGNOSIS — J4520 Mild intermittent asthma, uncomplicated: Secondary | ICD-10-CM | POA: Diagnosis not present

## 2019-03-02 DIAGNOSIS — E6609 Other obesity due to excess calories: Secondary | ICD-10-CM | POA: Diagnosis not present

## 2019-04-29 DIAGNOSIS — H524 Presbyopia: Secondary | ICD-10-CM | POA: Diagnosis not present

## 2019-06-18 DIAGNOSIS — Z1283 Encounter for screening for malignant neoplasm of skin: Secondary | ICD-10-CM | POA: Diagnosis not present

## 2019-06-18 DIAGNOSIS — L814 Other melanin hyperpigmentation: Secondary | ICD-10-CM | POA: Diagnosis not present

## 2019-06-18 DIAGNOSIS — L821 Other seborrheic keratosis: Secondary | ICD-10-CM | POA: Diagnosis not present

## 2019-06-18 DIAGNOSIS — L820 Inflamed seborrheic keratosis: Secondary | ICD-10-CM | POA: Diagnosis not present

## 2019-06-18 DIAGNOSIS — L905 Scar conditions and fibrosis of skin: Secondary | ICD-10-CM | POA: Diagnosis not present

## 2019-06-18 DIAGNOSIS — L578 Other skin changes due to chronic exposure to nonionizing radiation: Secondary | ICD-10-CM | POA: Diagnosis not present

## 2019-06-18 DIAGNOSIS — D1800 Hemangioma unspecified site: Secondary | ICD-10-CM | POA: Diagnosis not present

## 2019-06-18 DIAGNOSIS — L918 Other hypertrophic disorders of the skin: Secondary | ICD-10-CM | POA: Diagnosis not present

## 2019-06-25 DIAGNOSIS — E118 Type 2 diabetes mellitus with unspecified complications: Secondary | ICD-10-CM | POA: Diagnosis not present

## 2019-07-02 DIAGNOSIS — E7800 Pure hypercholesterolemia, unspecified: Secondary | ICD-10-CM | POA: Diagnosis not present

## 2019-07-02 DIAGNOSIS — I10 Essential (primary) hypertension: Secondary | ICD-10-CM | POA: Diagnosis not present

## 2019-07-02 DIAGNOSIS — J4520 Mild intermittent asthma, uncomplicated: Secondary | ICD-10-CM | POA: Diagnosis not present

## 2019-07-02 DIAGNOSIS — E118 Type 2 diabetes mellitus with unspecified complications: Secondary | ICD-10-CM | POA: Diagnosis not present

## 2019-11-11 DIAGNOSIS — E7800 Pure hypercholesterolemia, unspecified: Secondary | ICD-10-CM | POA: Diagnosis not present

## 2019-11-11 DIAGNOSIS — E118 Type 2 diabetes mellitus with unspecified complications: Secondary | ICD-10-CM | POA: Diagnosis not present

## 2019-11-18 DIAGNOSIS — E118 Type 2 diabetes mellitus with unspecified complications: Secondary | ICD-10-CM | POA: Diagnosis not present

## 2019-11-18 DIAGNOSIS — J4520 Mild intermittent asthma, uncomplicated: Secondary | ICD-10-CM | POA: Diagnosis not present

## 2019-11-18 DIAGNOSIS — I10 Essential (primary) hypertension: Secondary | ICD-10-CM | POA: Diagnosis not present

## 2019-11-18 DIAGNOSIS — Z0001 Encounter for general adult medical examination with abnormal findings: Secondary | ICD-10-CM | POA: Diagnosis not present

## 2020-03-15 DIAGNOSIS — E118 Type 2 diabetes mellitus with unspecified complications: Secondary | ICD-10-CM | POA: Diagnosis not present

## 2020-03-22 DIAGNOSIS — J4520 Mild intermittent asthma, uncomplicated: Secondary | ICD-10-CM | POA: Diagnosis not present

## 2020-03-22 DIAGNOSIS — I10 Essential (primary) hypertension: Secondary | ICD-10-CM | POA: Diagnosis not present

## 2020-03-22 DIAGNOSIS — E118 Type 2 diabetes mellitus with unspecified complications: Secondary | ICD-10-CM | POA: Diagnosis not present

## 2020-03-22 DIAGNOSIS — E7800 Pure hypercholesterolemia, unspecified: Secondary | ICD-10-CM | POA: Diagnosis not present

## 2020-07-17 DIAGNOSIS — E118 Type 2 diabetes mellitus with unspecified complications: Secondary | ICD-10-CM | POA: Diagnosis not present

## 2020-07-24 DIAGNOSIS — I10 Essential (primary) hypertension: Secondary | ICD-10-CM | POA: Diagnosis not present

## 2020-07-24 DIAGNOSIS — J4520 Mild intermittent asthma, uncomplicated: Secondary | ICD-10-CM | POA: Diagnosis not present

## 2020-07-24 DIAGNOSIS — E7800 Pure hypercholesterolemia, unspecified: Secondary | ICD-10-CM | POA: Diagnosis not present

## 2020-07-24 DIAGNOSIS — E118 Type 2 diabetes mellitus with unspecified complications: Secondary | ICD-10-CM | POA: Diagnosis not present

## 2020-08-21 ENCOUNTER — Other Ambulatory Visit: Payer: Self-pay | Admitting: Internal Medicine

## 2020-11-14 DIAGNOSIS — E118 Type 2 diabetes mellitus with unspecified complications: Secondary | ICD-10-CM | POA: Diagnosis not present

## 2020-11-21 DIAGNOSIS — I10 Essential (primary) hypertension: Secondary | ICD-10-CM | POA: Diagnosis not present

## 2020-11-21 DIAGNOSIS — J4520 Mild intermittent asthma, uncomplicated: Secondary | ICD-10-CM | POA: Diagnosis not present

## 2020-11-21 DIAGNOSIS — E7800 Pure hypercholesterolemia, unspecified: Secondary | ICD-10-CM | POA: Diagnosis not present

## 2020-11-21 DIAGNOSIS — Z0001 Encounter for general adult medical examination with abnormal findings: Secondary | ICD-10-CM | POA: Diagnosis not present

## 2021-03-16 DIAGNOSIS — E118 Type 2 diabetes mellitus with unspecified complications: Secondary | ICD-10-CM | POA: Diagnosis not present

## 2021-03-23 ENCOUNTER — Other Ambulatory Visit: Payer: Self-pay

## 2021-03-23 ENCOUNTER — Other Ambulatory Visit: Payer: Self-pay | Admitting: Internal Medicine

## 2021-03-23 DIAGNOSIS — E118 Type 2 diabetes mellitus with unspecified complications: Secondary | ICD-10-CM | POA: Diagnosis not present

## 2021-03-23 DIAGNOSIS — E7800 Pure hypercholesterolemia, unspecified: Secondary | ICD-10-CM | POA: Diagnosis not present

## 2021-03-23 DIAGNOSIS — I10 Essential (primary) hypertension: Secondary | ICD-10-CM | POA: Diagnosis not present

## 2021-03-23 DIAGNOSIS — J4520 Mild intermittent asthma, uncomplicated: Secondary | ICD-10-CM | POA: Diagnosis not present

## 2021-03-23 MED ORDER — HYDROCHLOROTHIAZIDE 25 MG PO TABS
25.0000 mg | ORAL_TABLET | Freq: Every day | ORAL | 3 refills | Status: DC
  Filled 2021-03-23: qty 90, 90d supply, fill #0
  Filled 2021-07-01: qty 90, 90d supply, fill #1
  Filled 2021-10-21: qty 90, 90d supply, fill #2
  Filled 2022-01-27: qty 90, 90d supply, fill #3

## 2021-03-23 MED ORDER — LOSARTAN POTASSIUM 50 MG PO TABS
50.0000 mg | ORAL_TABLET | Freq: Every day | ORAL | 3 refills | Status: DC
  Filled 2021-03-23: qty 90, 90d supply, fill #0
  Filled 2021-07-01: qty 90, 90d supply, fill #1
  Filled 2021-10-21: qty 90, 90d supply, fill #2
  Filled 2022-01-27: qty 90, 90d supply, fill #3

## 2021-06-04 ENCOUNTER — Other Ambulatory Visit: Payer: Self-pay | Admitting: Internal Medicine

## 2021-06-04 DIAGNOSIS — Z1231 Encounter for screening mammogram for malignant neoplasm of breast: Secondary | ICD-10-CM

## 2021-06-15 ENCOUNTER — Other Ambulatory Visit: Payer: Self-pay

## 2021-06-15 ENCOUNTER — Ambulatory Visit: Payer: 59 | Attending: Internal Medicine

## 2021-06-15 ENCOUNTER — Ambulatory Visit
Admission: RE | Admit: 2021-06-15 | Discharge: 2021-06-15 | Disposition: A | Discharge status: Home / Self Care | Payer: 59 | Source: Ambulatory Visit | Attending: Internal Medicine | Admitting: Internal Medicine

## 2021-06-15 DIAGNOSIS — Z1231 Encounter for screening mammogram for malignant neoplasm of breast: Secondary | ICD-10-CM | POA: Diagnosis not present

## 2021-06-15 DIAGNOSIS — Z23 Encounter for immunization: Secondary | ICD-10-CM

## 2021-06-15 MED ORDER — PFIZER COVID-19 VAC BIVALENT 30 MCG/0.3ML IM SUSP
INTRAMUSCULAR | 0 refills | Status: AC
  Filled 2021-06-15: qty 0.3, 30d supply, fill #0

## 2021-06-15 NOTE — Progress Notes (Signed)
   Covid-19 Vaccination Clinic  Name:  Bianca Cooke    MRN: [id number] DOB: December 24, 1955  06/15/2021  [name] was observed post Covid-19 immunization for 15 minutes without incident. She was provided with Vaccine Information Sheet and instruction to access the V-Safe system.   Ms. Wires was instructed to call [phone] with any severe reactions post vaccine: Difficulty breathing  Swelling of face and throat  A fast heartbeat  A bad rash all over body  Dizziness and weakness   Lu Duffel, PharmD, MBA Clinical Acute Care Pharmacist

## 2021-06-19 DIAGNOSIS — E113393 Type 2 diabetes mellitus with moderate nonproliferative diabetic retinopathy without macular edema, bilateral: Secondary | ICD-10-CM | POA: Diagnosis not present

## 2021-07-02 ENCOUNTER — Other Ambulatory Visit: Payer: Self-pay

## 2021-07-19 DIAGNOSIS — E118 Type 2 diabetes mellitus with unspecified complications: Secondary | ICD-10-CM | POA: Diagnosis not present

## 2021-07-26 DIAGNOSIS — J4520 Mild intermittent asthma, uncomplicated: Secondary | ICD-10-CM | POA: Diagnosis not present

## 2021-07-26 DIAGNOSIS — E118 Type 2 diabetes mellitus with unspecified complications: Secondary | ICD-10-CM | POA: Diagnosis not present

## 2021-07-26 DIAGNOSIS — E7800 Pure hypercholesterolemia, unspecified: Secondary | ICD-10-CM | POA: Diagnosis not present

## 2021-07-26 DIAGNOSIS — I10 Essential (primary) hypertension: Secondary | ICD-10-CM | POA: Diagnosis not present

## 2021-10-22 ENCOUNTER — Other Ambulatory Visit: Payer: Self-pay

## 2021-11-16 DIAGNOSIS — E118 Type 2 diabetes mellitus with unspecified complications: Secondary | ICD-10-CM | POA: Diagnosis not present

## 2021-11-23 DIAGNOSIS — I10 Essential (primary) hypertension: Secondary | ICD-10-CM | POA: Diagnosis not present

## 2021-11-23 DIAGNOSIS — J4520 Mild intermittent asthma, uncomplicated: Secondary | ICD-10-CM | POA: Diagnosis not present

## 2021-11-23 DIAGNOSIS — E118 Type 2 diabetes mellitus with unspecified complications: Secondary | ICD-10-CM | POA: Diagnosis not present

## 2021-11-23 DIAGNOSIS — Z0001 Encounter for general adult medical examination with abnormal findings: Secondary | ICD-10-CM | POA: Diagnosis not present

## 2021-11-29 DIAGNOSIS — E113393 Type 2 diabetes mellitus with moderate nonproliferative diabetic retinopathy without macular edema, bilateral: Secondary | ICD-10-CM | POA: Diagnosis not present

## 2022-01-27 ENCOUNTER — Other Ambulatory Visit: Payer: Self-pay

## 2022-01-28 ENCOUNTER — Other Ambulatory Visit: Payer: Self-pay

## 2022-03-18 DIAGNOSIS — E118 Type 2 diabetes mellitus with unspecified complications: Secondary | ICD-10-CM | POA: Diagnosis not present

## 2022-03-25 ENCOUNTER — Other Ambulatory Visit: Payer: Self-pay

## 2022-03-25 DIAGNOSIS — J4520 Mild intermittent asthma, uncomplicated: Secondary | ICD-10-CM | POA: Diagnosis not present

## 2022-03-25 DIAGNOSIS — I10 Essential (primary) hypertension: Secondary | ICD-10-CM | POA: Diagnosis not present

## 2022-03-25 DIAGNOSIS — E7800 Pure hypercholesterolemia, unspecified: Secondary | ICD-10-CM | POA: Diagnosis not present

## 2022-03-25 DIAGNOSIS — E118 Type 2 diabetes mellitus with unspecified complications: Secondary | ICD-10-CM | POA: Diagnosis not present

## 2022-03-25 DIAGNOSIS — E6609 Other obesity due to excess calories: Secondary | ICD-10-CM | POA: Diagnosis not present

## 2022-03-25 MED ORDER — HYDROCHLOROTHIAZIDE 25 MG PO TABS
25.0000 mg | ORAL_TABLET | Freq: Every day | ORAL | 3 refills | Status: AC
  Filled 2022-03-25 – 2022-04-28 (×2): qty 90, 90d supply, fill #0
  Filled 2022-07-29: qty 90, 90d supply, fill #1

## 2022-03-25 MED ORDER — LOSARTAN POTASSIUM 50 MG PO TABS
50.0000 mg | ORAL_TABLET | Freq: Every day | ORAL | 3 refills | Status: AC
  Filled 2022-03-25 – 2022-04-28 (×2): qty 90, 90d supply, fill #0
  Filled 2022-07-29: qty 90, 90d supply, fill #1

## 2022-04-28 ENCOUNTER — Other Ambulatory Visit: Payer: Self-pay

## 2022-04-29 ENCOUNTER — Other Ambulatory Visit: Payer: Self-pay

## 2022-06-27 DIAGNOSIS — H524 Presbyopia: Secondary | ICD-10-CM | POA: Diagnosis not present

## 2022-07-24 DIAGNOSIS — E118 Type 2 diabetes mellitus with unspecified complications: Secondary | ICD-10-CM | POA: Diagnosis not present

## 2022-07-30 ENCOUNTER — Other Ambulatory Visit: Payer: Self-pay

## 2022-07-31 ENCOUNTER — Other Ambulatory Visit: Payer: Self-pay

## 2022-07-31 DIAGNOSIS — E118 Type 2 diabetes mellitus with unspecified complications: Secondary | ICD-10-CM | POA: Diagnosis not present

## 2022-07-31 DIAGNOSIS — I10 Essential (primary) hypertension: Secondary | ICD-10-CM | POA: Diagnosis not present

## 2022-07-31 DIAGNOSIS — E6609 Other obesity due to excess calories: Secondary | ICD-10-CM | POA: Diagnosis not present

## 2022-07-31 DIAGNOSIS — E7800 Pure hypercholesterolemia, unspecified: Secondary | ICD-10-CM | POA: Diagnosis not present

## 2022-07-31 DIAGNOSIS — J4520 Mild intermittent asthma, uncomplicated: Secondary | ICD-10-CM | POA: Diagnosis not present

## 2022-07-31 MED ORDER — GLIPIZIDE 5 MG PO TABS
ORAL_TABLET | ORAL | 3 refills | Status: AC
  Filled 2022-07-31: qty 90, 90d supply, fill #0

## 2022-10-24 ENCOUNTER — Other Ambulatory Visit: Payer: Self-pay

## 2022-11-26 ENCOUNTER — Other Ambulatory Visit: Payer: Self-pay

## 2022-11-26 DIAGNOSIS — Z1231 Encounter for screening mammogram for malignant neoplasm of breast: Secondary | ICD-10-CM

## 2023-01-10 ENCOUNTER — Ambulatory Visit
Admission: RE | Admit: 2023-01-10 | Discharge: 2023-01-10 | Disposition: A | Discharge status: Home / Self Care | Payer: Medicare HMO | Source: Ambulatory Visit | Attending: Internal Medicine

## 2023-01-10 DIAGNOSIS — Z1231 Encounter for screening mammogram for malignant neoplasm of breast: Secondary | ICD-10-CM | POA: Insufficient documentation

## 2024-07-26 DIAGNOSIS — E113393 Type 2 diabetes mellitus with moderate nonproliferative diabetic retinopathy without macular edema, bilateral: Secondary | ICD-10-CM | POA: Diagnosis not present
